# Patient Record
Sex: Male | Born: 1965 | Race: Black or African American | Hispanic: No | Marital: Single | Smoking: Current every day smoker
Health system: Southern US, Community
[De-identification: ages and names within clinical notes are randomized; demographics above are authoritative.]

## PROBLEM LIST (undated history)

## (undated) DIAGNOSIS — I10 Essential (primary) hypertension: Secondary | ICD-10-CM

## (undated) HISTORY — PX: TOE SURGERY: SHX1073

---

## 2004-11-18 ENCOUNTER — Emergency Department: Payer: Self-pay | Admitting: Emergency Medicine

## 2008-02-16 ENCOUNTER — Observation Stay: Payer: Self-pay | Admitting: General Practice

## 2008-09-18 ENCOUNTER — Emergency Department: Payer: Self-pay | Admitting: Emergency Medicine

## 2008-10-16 ENCOUNTER — Emergency Department: Payer: Self-pay | Admitting: Emergency Medicine

## 2010-04-05 ENCOUNTER — Ambulatory Visit: Payer: Self-pay

## 2011-04-26 IMAGING — CR DG CHEST 2V
1 series · 2 of 2 positions shown · non-contrast
Comparison: none

REASON FOR EXAM: wheezing
COMMENTS:

[Series 1: view not recorded · 0.17mm/px · 2 of 2 slices shown]
[im 1/2]
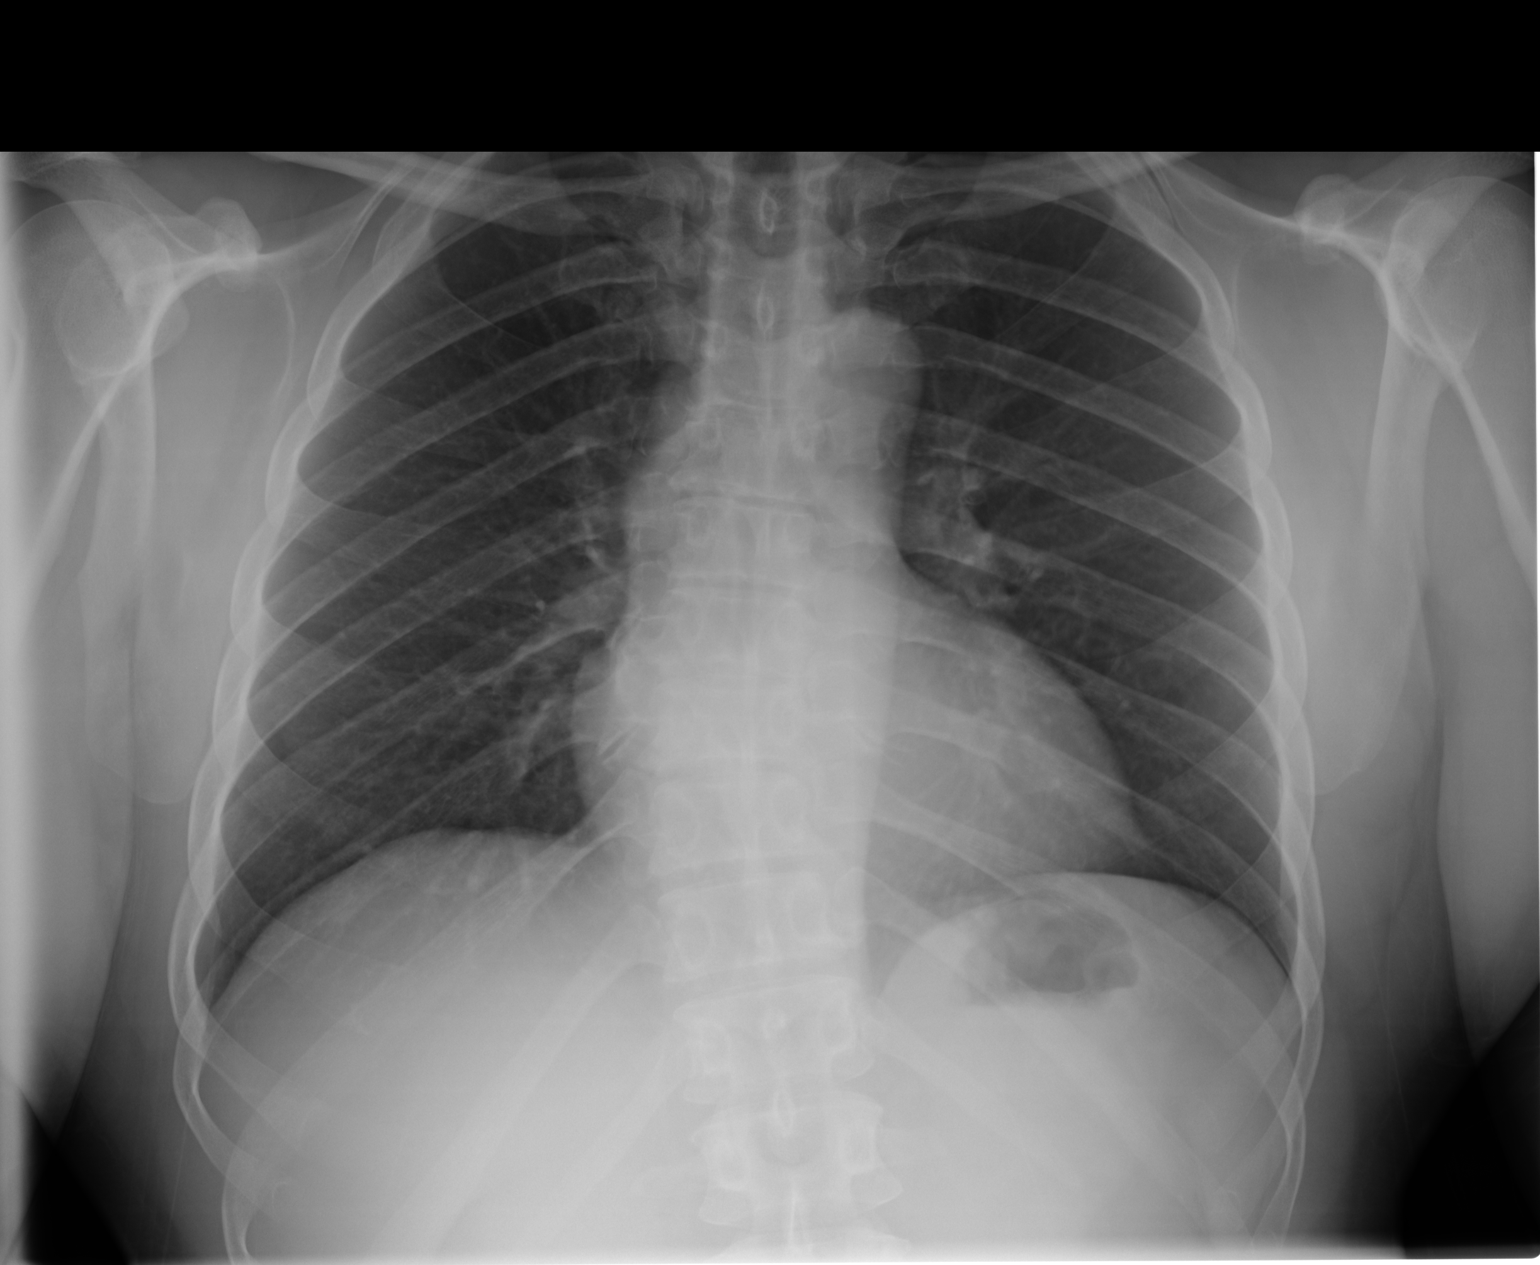
[im 2/2]
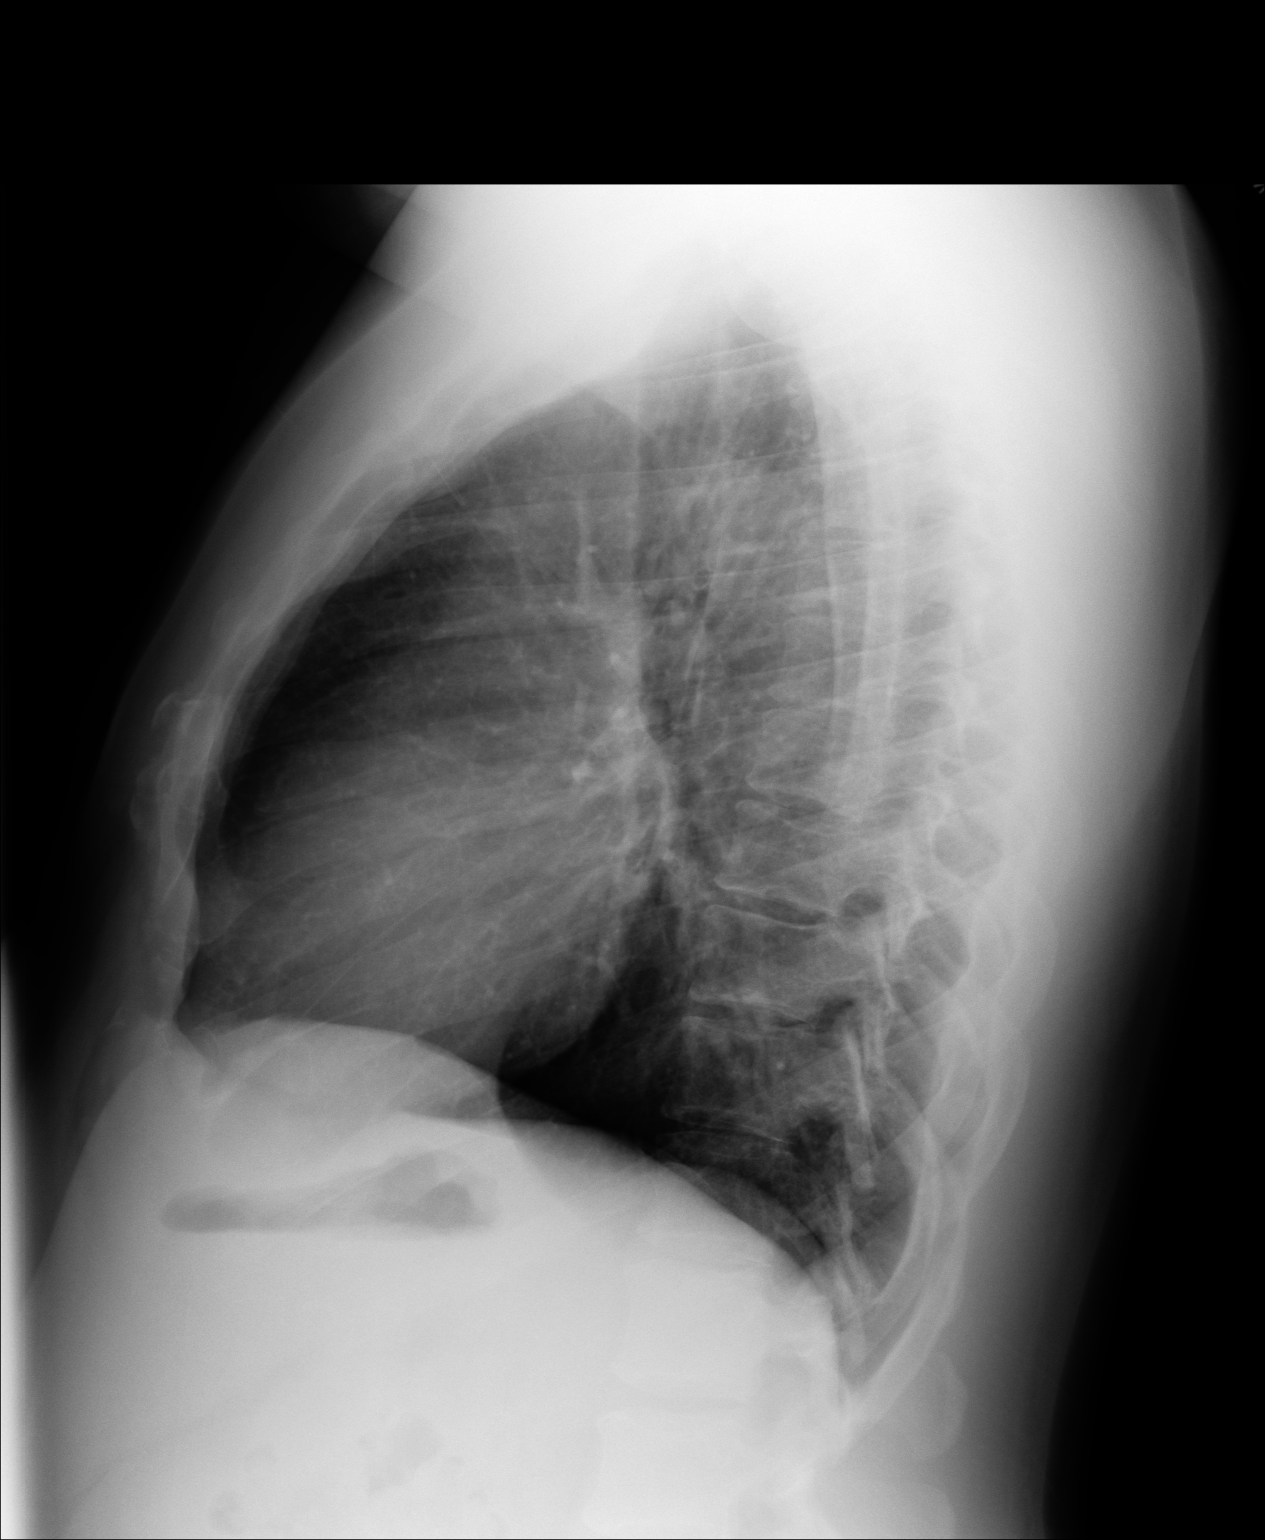

[2 of 2 positions shown; findings below may reference images not displayed]

PROCEDURE:     DXR - DXR CHEST PA (OR AP) AND LATERAL  - April 05, 2010  [DATE]

RESULT:     The lungs are hyperinflated consistent with COPD. The cardiac
silhouette and pulmonary vasculature are normal. The bony and mediastinal
structures are within normal limits. There is a gentle minimal scoliotic
curvature concave to the left in the thoracic spine.
IMPRESSION: Mild thoracic scoliosis. Mild hyperinflation. Correlate
clinically.

## 2015-04-05 ENCOUNTER — Encounter: Payer: Self-pay | Admitting: Medical Oncology

## 2015-04-05 ENCOUNTER — Emergency Department
Admission: EM | Admit: 2015-04-05 | Discharge: 2015-04-05 | Disposition: A | Discharge status: Home / Self Care | Payer: Self-pay | Attending: Emergency Medicine | Admitting: Emergency Medicine

## 2015-04-05 DIAGNOSIS — Z720 Tobacco use: Secondary | ICD-10-CM | POA: Insufficient documentation

## 2015-04-05 DIAGNOSIS — S39012A Strain of muscle, fascia and tendon of lower back, initial encounter: Secondary | ICD-10-CM | POA: Insufficient documentation

## 2015-04-05 DIAGNOSIS — Y998 Other external cause status: Secondary | ICD-10-CM | POA: Insufficient documentation

## 2015-04-05 DIAGNOSIS — Y9389 Activity, other specified: Secondary | ICD-10-CM | POA: Insufficient documentation

## 2015-04-05 DIAGNOSIS — I10 Essential (primary) hypertension: Secondary | ICD-10-CM | POA: Insufficient documentation

## 2015-04-05 DIAGNOSIS — X58XXXA Exposure to other specified factors, initial encounter: Secondary | ICD-10-CM | POA: Insufficient documentation

## 2015-04-05 DIAGNOSIS — Y9289 Other specified places as the place of occurrence of the external cause: Secondary | ICD-10-CM | POA: Insufficient documentation

## 2015-04-05 HISTORY — DX: Essential (primary) hypertension: I10

## 2015-04-05 MED ORDER — HYDROCODONE-ACETAMINOPHEN 5-325 MG PO TABS: 1.0000 | ORAL_TABLET | ORAL | Status: AC | PRN

## 2015-04-05 MED ORDER — IBUPROFEN 800 MG PO TABS: 800.0000 mg | ORAL_TABLET | Freq: Three times a day (TID) | ORAL | Status: AC | PRN

## 2015-04-05 MED ORDER — CYCLOBENZAPRINE HCL 10 MG PO TABS: 10.0000 mg | ORAL_TABLET | Freq: Three times a day (TID) | ORAL | Status: AC | PRN

## 2015-04-05 MED ORDER — KETOROLAC TROMETHAMINE 30 MG/ML IJ SOLN
INTRAMUSCULAR | Status: AC
  Administered 2015-04-05: 60 mg via INTRAMUSCULAR
  Filled 2015-04-05: qty 2

## 2015-04-05 MED ORDER — KETOROLAC TROMETHAMINE 30 MG/ML IJ SOLN
60.0000 mg | Freq: Once | INTRAMUSCULAR | Status: AC
  Administered 2015-04-05: 60 mg via INTRAMUSCULAR

## 2015-04-05 NOTE — Discharge Instructions (Signed)
Back Pain, Adult °Low back pain is very common. About 1 in 5 people have back pain. The cause of low back pain is rarely dangerous. The pain often gets better over time. About half of people with a sudden onset of back pain feel better in just 2 weeks. About 8 in 10 people feel better by 6 weeks.  °CAUSES °Some common causes of back pain include: °· Strain of the muscles or ligaments supporting the spine. °· Wear and tear (degeneration) of the spinal discs. °· Arthritis. °· Direct injury to the back. °DIAGNOSIS °Most of the time, the direct cause of low back pain is not known. However, back pain can be treated effectively even when the exact cause of the pain is unknown. Answering your caregiver's questions about your overall health and symptoms is one of the most accurate ways to make sure the cause of your pain is not dangerous. If your caregiver needs more information, he or she may order lab work or imaging tests (X-rays or MRIs). However, even if imaging tests show changes in your back, this usually does not require surgery. °HOME CARE INSTRUCTIONS °For many people, back pain returns. Since low back pain is rarely dangerous, it is often a condition that people can learn to manage on their own.  °· Remain active. It is stressful on the back to sit or stand in one place. Do not sit, drive, or stand in one place for more than 30 minutes at a time. Take short walks on level surfaces as soon as pain allows. Try to increase the length of time you walk each day. °· Do not stay in bed. Resting more than 1 or 2 days can delay your recovery. °· Do not avoid exercise or work. Your body is made to move. It is not dangerous to be active, even though your back may hurt. Your back will likely heal faster if you return to being active before your pain is gone. °· Pay attention to your body when you  bend and lift. Many people have less discomfort when lifting if they bend their knees, keep the load close to their bodies, and  avoid twisting. Often, the most comfortable positions are those that put less stress on your recovering back. °· Find a comfortable position to sleep. Use a firm mattress and lie on your side with your knees slightly bent. If you lie on your back, put a pillow under your knees. °· Only take over-the-counter or prescription medicines as directed by your caregiver. Over-the-counter medicines to reduce pain and inflammation are often the most helpful. Your caregiver may prescribe muscle relaxant drugs. These medicines help dull your pain so you can more quickly return to your normal activities and healthy exercise. °· Put ice on the injured area. °¨ Put ice in a plastic bag. °¨ Place a towel between your skin and the bag. °¨ Leave the ice on for 15-20 minutes, 03-04 times a day for the first 2 to 3 days. After that, ice and heat may be alternated to reduce pain and spasms. °· Ask your caregiver about trying back exercises and gentle massage. This may be of some benefit. °· Avoid feeling anxious or stressed. Stress increases muscle tension and can worsen back pain. It is important to recognize when you are anxious or stressed and learn ways to manage it. Exercise is a great option. °SEEK MEDICAL CARE IF: °· You have pain that is not relieved with rest or medicine. °· You have pain that does not improve in 1 week. °· You have new symptoms. °· You are generally not feeling well. °SEEK   IMMEDIATE MEDICAL CARE IF:  °· You have pain that radiates from your back into your legs. °· You develop new bowel or bladder control problems. °· You have unusual weakness or numbness in your arms or legs. °· You develop nausea or vomiting. °· You develop abdominal pain. °· You feel faint. °Document Released: 10/08/2005 Document Revised: 04/08/2012 Document Reviewed: 02/09/2014 °[employer]® Patient Information ©2015 [employer]. This information is not intended to replace advice given to you by your health care provider. Make sure you  discuss any questions you have with your health care provider. °Muscle Strain °A muscle strain is an injury that occurs when a muscle is stretched beyond its normal length. Usually a small number of muscle fibers are torn when this happens. Muscle strain is rated in degrees. First-degree strains have the least amount of muscle fiber tearing and pain. Second-degree and third-degree strains have increasingly more tearing and pain.  °Usually, recovery from muscle strain takes 1-2 weeks. Complete healing takes 5-6 weeks.  °CAUSES  °Muscle strain happens when a sudden, violent force placed on a muscle stretches it too far. This may occur with lifting, sports, or a fall.  °RISK FACTORS °Muscle strain is especially common in athletes.  °SIGNS AND SYMPTOMS °At the site of the muscle strain, there may be: °· Pain. °· Bruising. °· Swelling. °· Difficulty using the muscle due to pain or lack of normal function. °DIAGNOSIS  °Your health care provider will perform a physical exam and ask about your medical history. °TREATMENT  °Often, the best treatment for a muscle strain is resting, icing, and applying cold compresses to the injured area.   °HOME CARE INSTRUCTIONS  °· Use the PRICE method of treatment to promote muscle healing during the first 2-3 days after your injury. The PRICE method involves: °¨ Protecting the muscle from being injured again. °¨ Restricting your activity and resting the injured body part. °¨ Icing your injury. To do this, put ice in a plastic bag. Place a towel between your skin and the bag. Then, apply the ice and leave it on from 15-20 minutes each hour. After the third day, switch to moist heat packs. °¨ Apply compression to the injured area with a splint or elastic bandage. Be careful not to wrap it too tightly. This may interfere with blood circulation or increase swelling. °¨ Elevate the injured body part above the level of your heart as often as you can. °· Only take over-the-counter or  prescription medicines for pain, discomfort, or fever as directed by your health care provider. °· Warming up prior to exercise helps to prevent future muscle strains. °SEEK MEDICAL CARE IF:  °· You have increasing pain or swelling in the injured area. °· You have numbness, tingling, or a significant loss of strength in the injured area. °MAKE SURE YOU:  °· Understand these instructions. °· Will watch your condition. °· Will get help right away if you are not doing well or get worse. °Document Released: 10/08/2005 Document Revised: 07/29/2013 Document Reviewed: 05/07/2013 °[employer]® Patient Information ©2015 [employer]. This information is not intended to replace advice given to you by your health care provider. Make sure you discuss any questions you have with your health care provider. ° °

## 2015-04-05 NOTE — ED Provider Notes (Signed)
[facility] Emergency Department Provider Note  ____________________________________________  Time seen: Approximately 11:02 AM  I have reviewed the triage vital signs and the nursing notes.   HISTORY  Chief Complaint Back Pain    HPI Bob Schneider is a 49 y.o. male who presents for evaluation of low back pain. Patient states about 2 days ago he was " trying to catch a pig". She reports pulled something in his lower right back at this time. Describes pain as 10 out of 10 radiating down leg.   Past Medical History  Diagnosis Date  . Hypertension     There are no active problems to display for this patient.   Past Surgical History  Procedure Laterality Date  . Toe surgery      Current Outpatient Rx  Name  Route  Sig  Dispense  Refill  . cyclobenzaprine (FLEXERIL) 10 MG tablet   Oral   Take 1 tablet (10 mg total) by mouth every 8 (eight) hours as needed for muscle spasms.   30 tablet   1   . HYDROcodone-acetaminophen (NORCO) 5-325 MG per tablet   Oral   Take 1-2 tablets by mouth every 4 (four) hours as needed for moderate pain.   15 tablet   0   . ibuprofen (ADVIL,MOTRIN) 800 MG tablet   Oral   Take 1 tablet (800 mg total) by mouth every 8 (eight) hours as needed.   30 tablet   0     Allergies Review of patient's allergies indicates no known allergies.  No family history on file.  Social History History  Substance Use Topics  . Smoking status: Current Every Day Smoker -- 0.50 packs/day  . Smokeless tobacco: Not on file  . Alcohol Use: Yes     Comment: socially    Review of Systems Constitutional: No fever/chills Eyes: No visual changes. ENT: No sore throat. Cardiovascular: Denies chest pain. Respiratory: Denies shortness of breath. Gastrointestinal: No abdominal pain.  No nausea, no vomiting.  No diarrhea.  No constipation. Genitourinary: Negative for dysuria. Musculoskeletal: Positive for low back pain with radiation  down leg. Skin: Negative for rash. Neurological: Negative for headaches, focal weakness or numbness.  10-point ROS otherwise negative.  ____________________________________________   PHYSICAL EXAM:  VITAL SIGNS: ED Triage Vitals  Enc Vitals Group     BP 04/05/15 1048 132/86 mmHg     Pulse Rate 04/05/15 1048 63     Resp 04/05/15 1048 17     Temp 04/05/15 1048 98.1 F (36.7 C)     Temp Source 04/05/15 1048 Oral     SpO2 04/05/15 1048 100 %     Weight 04/05/15 1048 178 lb (80.74 kg)     Height 04/05/15 1048  (1.753 m)     Head Cir --      Peak Flow --      Pain Score 04/05/15 1048 10     Pain Loc --      Pain Edu? --      Excl. in GC? --     Constitutional: Alert and oriented. Well appearing and in no acute distress. Eyes: Conjunctivae are normal. PERRL. EOMI. Head: Atraumatic. Nose: No congestion/rhinnorhea. Mouth/Throat: Mucous membranes are moist.  Oropharynx non-erythematous. Neck: No stridor.   Cardiovascular: Normal rate, regular rhythm. Grossly normal heart sounds.  Good peripheral circulation. Respiratory: Normal respiratory effort.  No retractions. Lungs CTAB. Gastrointestinal: Soft and nontender. No distention. No abdominal bruits. No CVA tenderness. Musculoskeletal: No lower  extremity tenderness nor edema.  No joint effusions. Straight leg raise negative bilaterally point tenderness noted to the right paraspinal lumbar muscles Neurologic:  Normal speech and language. No gross focal neurologic deficits are appreciated. Speech is normal. No gait instability. Skin:  Skin is warm, dry and intact. No rash noted. Psychiatric: Mood and affect are normal. Speech and behavior are normal.  ____________________________________________   LABS (all labs ordered are listed, but only abnormal results are displayed)  Labs Reviewed - No data to  display ____________________________________________  EKG  Deferred ____________________________________________  RADIOLOGY  Deferred ____________________________________________   PROCEDURES  Procedure(s) performed: None  Critical Care performed: No  ____________________________________________   INITIAL IMPRESSION / ASSESSMENT AND PLAN / ED COURSE  Pertinent labs & imaging results that were available during my care of the patient were reviewed by me and considered in my medical decision making (see chart for details).  Acute lumbar strain. Rx given for Flexeril 10 mg, Motrin 800 mg and 2 day course of hydrocodone. Patient voices no other emergency medical complaints at this visit. Will follow-up or return if worsening symptomology. ____________________________________________   FINAL CLINICAL IMPRESSION(S) / ED DIAGNOSES  Final diagnoses:  Lumbar strain, initial encounter      Evangeline Dakin, PA-C 04/05/15 1145  Myrna Blazer, MD 04/05/15 [phone]

## 2015-04-05 NOTE — ED Notes (Signed)
States he was chasing something 2 days ago  Developed lower back pain which is radiating into right leg slightly.ambulates well to treatment room

## 2015-04-05 NOTE — ED Notes (Signed)
Pt ambulatory to triage with reports of lower back pain that began about 2 days ago, after "trying to catch a pig".
